# Patient Record
Sex: Male | Born: 1990 | Race: Black or African American | Hispanic: No | Marital: Single | Smoking: Never smoker
Health system: Southern US, Community
[De-identification: ages and names within clinical notes are randomized; demographics above are authoritative.]

## PROBLEM LIST (undated history)

## (undated) DIAGNOSIS — G809 Cerebral palsy, unspecified: Secondary | ICD-10-CM

---

## 2018-02-10 ENCOUNTER — Encounter (HOSPITAL_COMMUNITY): Payer: Self-pay | Admitting: *Deleted

## 2018-02-10 ENCOUNTER — Other Ambulatory Visit: Payer: Self-pay

## 2018-02-10 ENCOUNTER — Emergency Department (HOSPITAL_COMMUNITY): Payer: Medicaid - Out of State

## 2018-02-10 ENCOUNTER — Emergency Department (HOSPITAL_COMMUNITY)
Admission: EM | Admit: 2018-02-10 | Discharge: 2018-02-10 | Disposition: A | Discharge status: Home / Self Care | Payer: Medicaid - Out of State | Attending: Emergency Medicine | Admitting: Emergency Medicine

## 2018-02-10 DIAGNOSIS — R0789 Other chest pain: Secondary | ICD-10-CM | POA: Insufficient documentation

## 2018-02-10 HISTORY — DX: Cerebral palsy, unspecified: G80.9

## 2018-02-10 LAB — BASIC METABOLIC PANEL
Anion gap: 9 (ref 5–15)
BUN: 11 mg/dL (ref 6–20)
CALCIUM: 9.8 mg/dL (ref 8.9–10.3)
CHLORIDE: 107 mmol/L (ref 98–111)
CO2: 26 mmol/L (ref 22–32)
CREATININE: 0.82 mg/dL (ref 0.61–1.24)
GFR calc Af Amer: >60 mL/min (ref >60)
Glucose, Bld: 86 mg/dL (ref 70–99)
Potassium: 4.2 mmol/L (ref 3.5–5.1)
SODIUM: 142 mmol/L (ref 135–145)

## 2018-02-10 LAB — CBC
HCT: 48.6 % (ref 39.0–52.0)
Hemoglobin: 15.7 g/dL (ref 13.0–17.0)
MCH: 29.6 pg (ref 26.0–34.0)
MCHC: 32.3 g/dL (ref 30.0–36.0)
MCV: 91.5 fL (ref 78.0–100.0)
PLATELETS: 152 10*3/uL (ref 150–400)
RBC: 5.31 MIL/uL (ref 4.22–5.81)
RDW: 13.1 % (ref 11.5–15.5)
WBC: 4.4 10*3/uL (ref 4.0–10.5)

## 2018-02-10 LAB — I-STAT TROPONIN, ED: TROPONIN I, POC: 0 ng/mL (ref 0.00–0.08)

## 2018-02-10 LAB — D-DIMER, QUANTITATIVE (NOT AT ARMC)

## 2018-02-10 NOTE — ED Notes (Signed)
Patient verbalizes understanding of discharge instructions. Opportunity for questioning and answers were provided. 

## 2018-02-10 NOTE — ED Notes (Signed)
ED Provider at bedside. 

## 2018-02-10 NOTE — ED Triage Notes (Addendum)
Pt arrived by [facility], reports intermittent chest pain for several days. Describes pain as tightness and increases with breathing. Had mild cough today. No sob. No distress is noted. Iv started pta and given ASA 324mg .

## 2018-02-10 NOTE — ED Provider Notes (Signed)
[facility] EM[facility]ENC[facility] Provider Note   CSN: [id number] Arrival date & time: 02/10/18  1109     History   Chief Complaint Chief Complaint  Patient presents with  . Chest Pain    HPI Joel Carlson is a 27 y.o. male.  HPI   Pt is a 27 y/o male with a h/o cerebral palsy who presents to the ED c/o chest pain that has been intermittent for the last 6 days. Pt states that sxs worsened yesterday and today that have been constant since 9:00AM. Describes pain as sharp and aching. Also reports chest tightness. Pain located on the left side.  Pain is not worse with exertion, movement or position.  Pain worse with deep inspiration. Pain does not radiate. Denies significant shortness of breath but does report an intermittent dry cough.  Denies other URI symptoms.  No hemoptysis, no lower extremity swelling or pain.  No history of VTE or cancer.  No recent admissions to the hospital or surgeries.  Does state that [place] and symptoms seem to worsen after this trip.  Mother at bedside states that pt has been walking and exerting himself a lot more recently. He also carried some heavy luggage today. She wonders if this could contribute to his sxs.  Pt denies other sxs.  Past Medical History:  Diagnosis Date  . Cerebral palsy (HCC)     There are no active problems to display for this patient.   History reviewed. No pertinent surgical history.      Home Medications    Prior to Admission medications   Not on File    Family History History reviewed. No pertinent family history.  Social History Social History   Tobacco Use  . Smoking status: Never Smoker  Substance Use Topics  . Alcohol use: Not on file  . Drug use: Not on file     Allergies   Heparin   Review of Systems Review of Systems  Constitutional: Negative for chills and fever.  HENT: Negative for congestion and  rhinorrhea.   Eyes: Negative for visual disturbance.  Respiratory: Negative for cough and shortness of breath.   Cardiovascular: Positive for chest pain. Negative for palpitations and leg swelling.  Gastrointestinal: Negative for abdominal pain, constipation, diarrhea, nausea and vomiting.  Genitourinary: Negative for flank pain.  Musculoskeletal: Positive for back pain (chronic).  Skin: Negative for color change.  Neurological: Negative for headaches.     Physical Exam Updated Vital Signs BP 119/75   Pulse 64   Temp 98.9 F (37.2 C) (Oral)   Resp 16   SpO2 100%   Physical Exam  Constitutional: He appears well-developed and well-nourished.  Non-toxic appearance. He does not appear ill. No distress.  Pt laughing and joking throughout history  HENT:  Head: Normocephalic and atraumatic.  Eyes: Conjunctivae are normal.  Neck: Neck supple.  Cardiovascular: Normal rate and regular rhythm.  No murmur heard. Pulmonary/Chest: Effort normal and breath sounds normal. No respiratory distress. He has no decreased breath sounds. He has no wheezes. He has no rhonchi. He has no rales.  No chest wall TTP  Abdominal: Soft. Bowel sounds are normal. He exhibits no distension. There is no tenderness.  Musculoskeletal: He exhibits no edema.       Right lower leg: Normal. He exhibits no tenderness and no edema.       Left lower leg: Normal. He exhibits no  tenderness and no edema.  Neurological: He is alert.  Skin: Skin is warm and dry. Capillary refill takes less than 2 seconds.  Psychiatric: He has a normal mood and affect.  Nursing note and vitals reviewed.  ED Treatments / Results  Labs (all labs ordered are listed, but only abnormal results are displayed) Labs Reviewed  BASIC METABOLIC PANEL  CBC  D-DIMER, QUANTITATIVE (NOT AT [facility])  I-STAT TROPONIN, ED    EKG EKG Interpretation  Date/Time:  Sunday February 10 2018 11:19:36 EDT Ventricular Rate:  62 PR Interval:  126 QRS  Duration: 86 QT Interval:  388 QTC Calculation: 393 R Axis:   61 Text Interpretation:  Normal sinus rhythm Normal ECG Confirmed by Kennis Carina [phone]) on 02/10/2018 1:51:27 PM   Radiology Dg Chest 2 View  Result Date: 02/10/2018 CLINICAL DATA:  Intermittent chest pain EXAM: CHEST - 2 VIEW COMPARISON:  None. FINDINGS: Lungs are clear.  No pleural effusion or pneumothorax. The heart is normal in size. Visualized osseous structures are within normal limits. IMPRESSION: Normal chest radiographs. Electronically Signed   By: Charline Bills M.D.   On: 02/10/2018 12:17    Procedures Procedures (including critical care time)  Medications Ordered in ED Medications - No data to display   Initial Impression / Assessment and Plan / ED Course  I have reviewed the triage vital signs and the nursing notes.  Pertinent labs & imaging results that were available during my care of the patient were reviewed by me and considered in my medical decision making (see chart for details).     Final Clinical Impressions(s) / ED Diagnoses   Final diagnoses:  Atypical chest pain   Pt With left-sided chest pain that has been intermittent for the last week but seem to worsen yesterday and today.  Has had mild cough that is not productive.  No shortness of breath.  Aspirin given prior to arrival by EMS.  Vitals been stable throughout his stay in the ED.  Did no recent long bus ride from [place] to [place], but otherwise no risk factors for PE.  Labs are reassuring.  CBC without anemia or leukocytosis.  BMP with normal electrolytes kidney function.  Troponin negative and d-dimer negative.  Chest x-ray negative for pneumothorax, pneumonia or widened mediastinum.  ECG with normal sinus rhythm, no ischemic changes or arrhythmia noted.  Suspect that symptoms are due to musculoskeletal chest pain given patient's reports of recent increasing activity and recently carrying some heavy luggage.  ACS would be very  unlikely on this patient.  Doubt AAA or other acute etiology of patient's symptoms at this time.  Advised over-the-counter anti-inflammatories and close PCP follow-up.  Return precautions discussed with patient and family at bedside who understand the plan and reasons to return to the ED.  All questions answered.   ED Discharge Orders    None       Karrie Meres, [place] 02/10/18 1354    Sabas Sous, MD 02/10/18 2312

## 2018-02-10 NOTE — Discharge Instructions (Signed)
You may take over the counter pain medications including tylenol and ibuprofen for your symptoms.   Please follow up with your primary care provider within 5-7 days for re-evaluation of your symptoms. If you do not have a primary care provider, information for a healthcare clinic has been provided for you to make arrangements for follow up care. Please return to the emergency department for any new or worsening symptoms.

## 2019-09-24 IMAGING — CR DG CHEST 2V
2 series · 2 of 2 positions shown · non-contrast
Comparison: None.

CLINICAL DATA: Intermittent chest pain

EXAM:
CHEST - 2 VIEW

[chest lat]
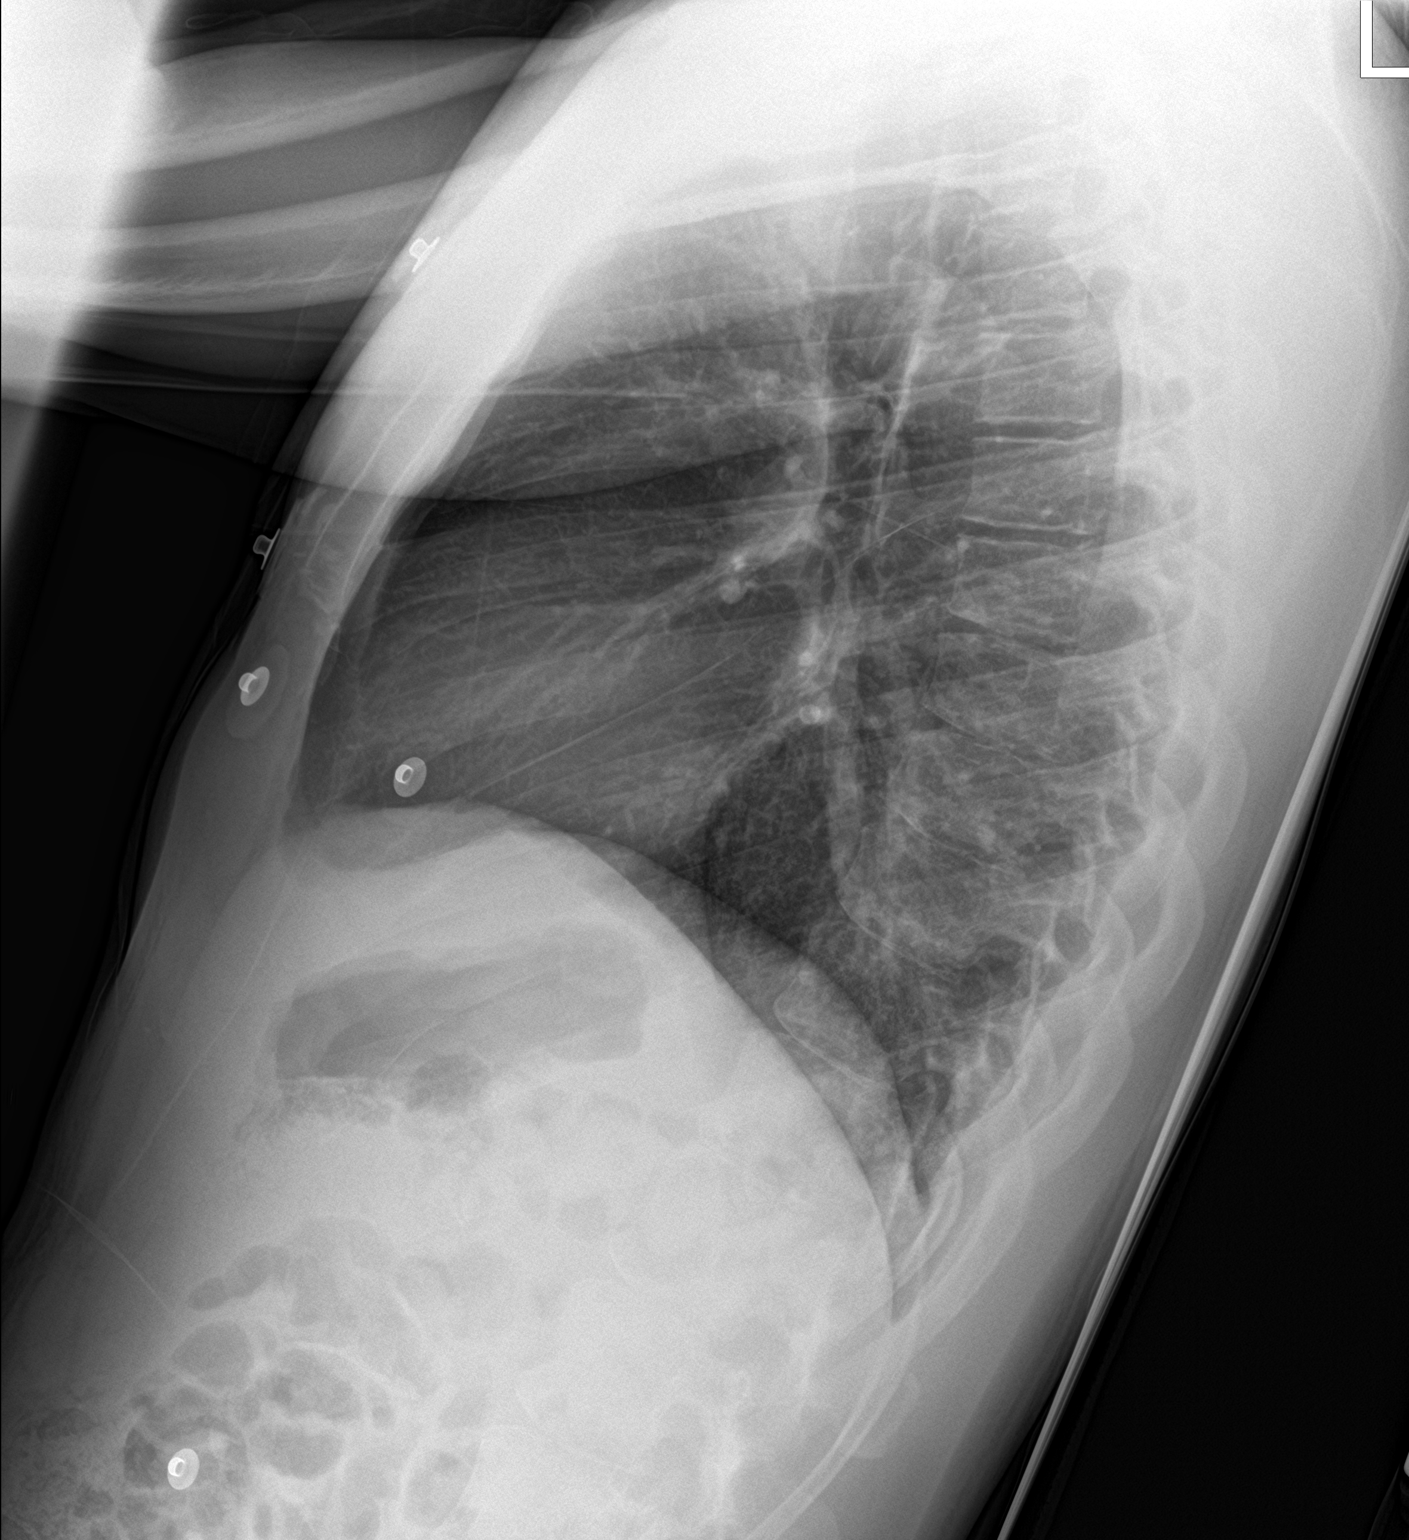

[chest ap]
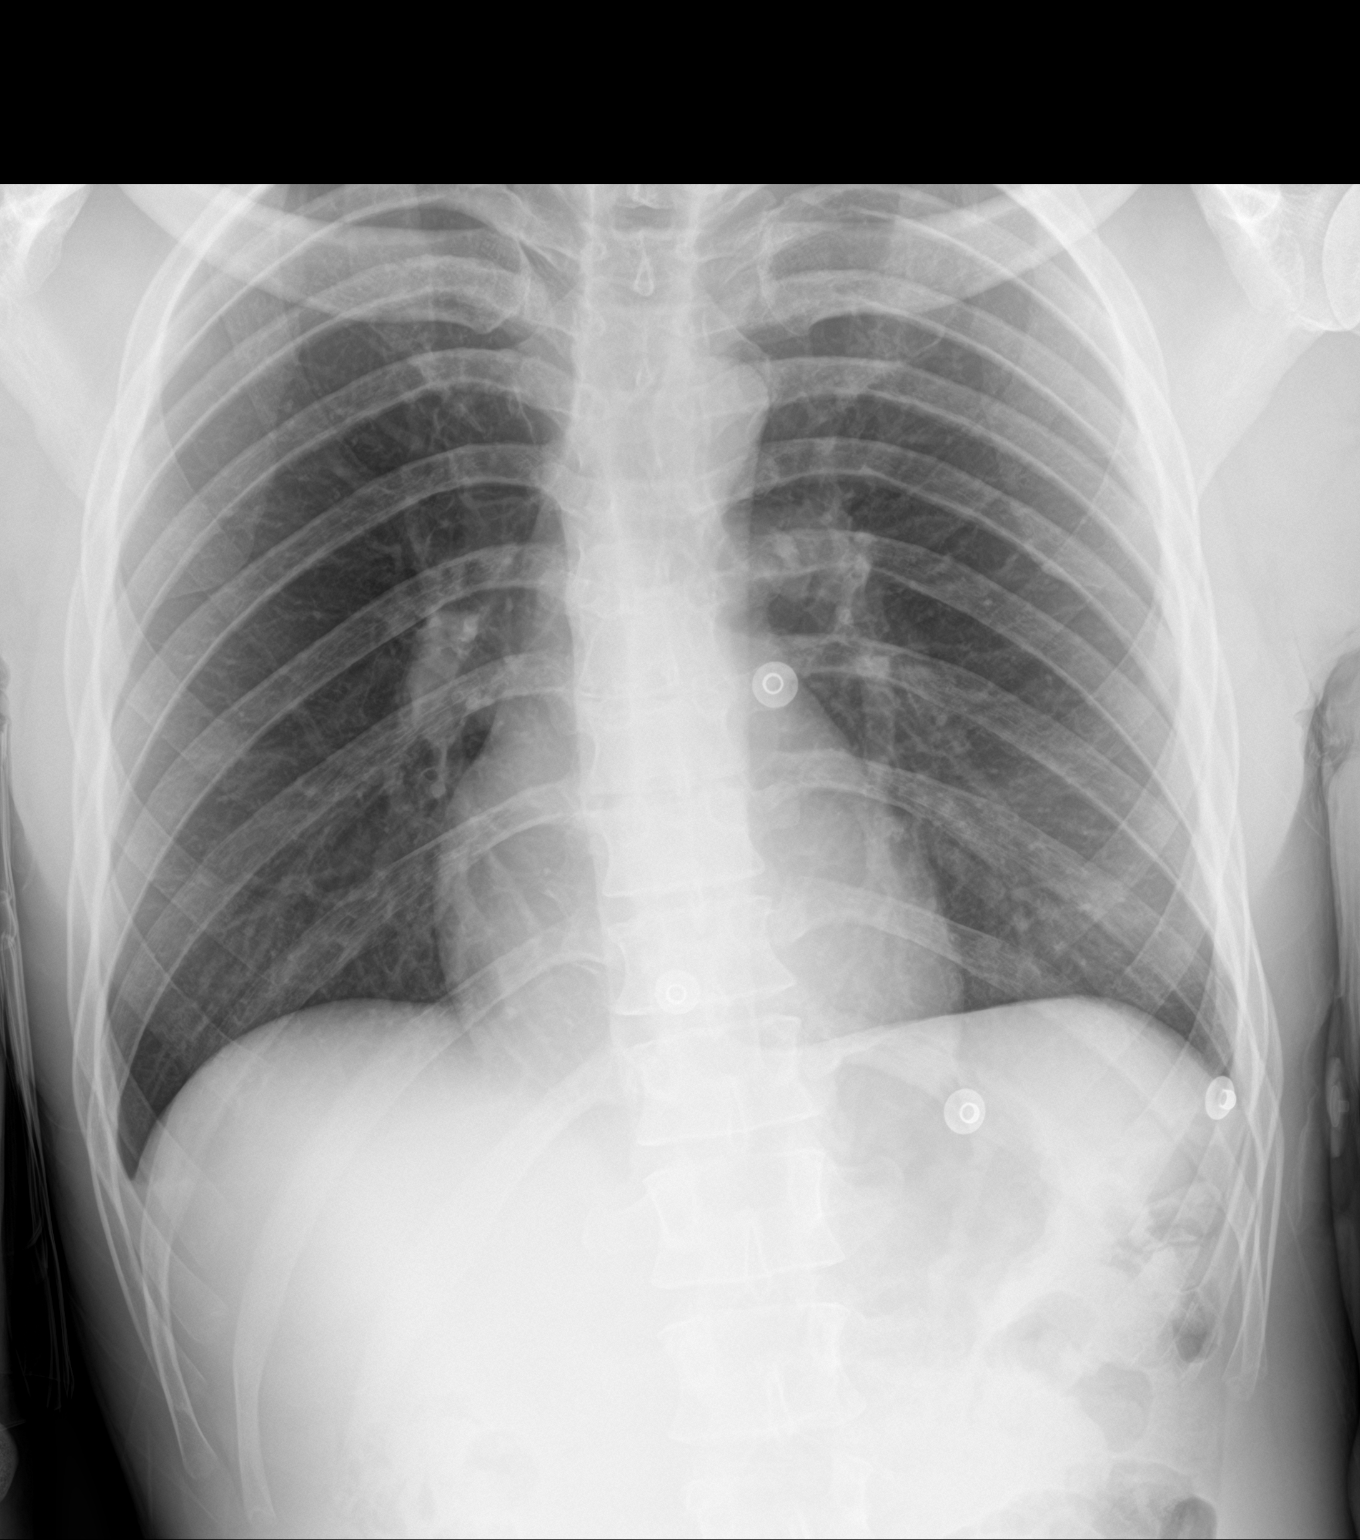

[2 of 2 positions shown; findings below may reference images not displayed]

FINDINGS: Lungs are clear.  No pleural effusion or pneumothorax.

The heart is normal in size.

Visualized osseous structures are within normal limits.
IMPRESSION: Normal chest radiographs.
